# Patient Record
Sex: Male | Born: 1975 | Hispanic: Refuse to answer | Marital: Married | Smoking: Never smoker
Health system: Southern US, Community
[De-identification: ages and names within clinical notes are randomized; demographics above are authoritative.]

## PROBLEM LIST (undated history)

## (undated) DIAGNOSIS — J329 Chronic sinusitis, unspecified: Secondary | ICD-10-CM

---

## 2002-05-27 ENCOUNTER — Emergency Department (HOSPITAL_COMMUNITY): Admission: EM | Admit: 2002-05-27 | Discharge: 2002-05-27 | Payer: Self-pay | Admitting: *Deleted

## 2004-10-01 ENCOUNTER — Emergency Department (HOSPITAL_COMMUNITY): Admission: EM | Admit: 2004-10-01 | Discharge: 2004-10-01 | Payer: Self-pay | Admitting: Emergency Medicine

## 2011-09-04 ENCOUNTER — Emergency Department (HOSPITAL_COMMUNITY)
Admission: EM | Admit: 2011-09-04 | Discharge: 2011-09-04 | Disposition: A | Discharge status: Home / Self Care | Payer: Self-pay | Source: Home / Self Care | Attending: Emergency Medicine | Admitting: Emergency Medicine

## 2011-09-04 ENCOUNTER — Encounter: Payer: Self-pay | Admitting: *Deleted

## 2011-09-04 DIAGNOSIS — J329 Chronic sinusitis, unspecified: Secondary | ICD-10-CM

## 2011-09-04 HISTORY — DX: Chronic sinusitis, unspecified: J32.9

## 2011-09-04 MED ORDER — AMOXICILLIN 500 MG PO CAPS: 1000.0000 mg | ORAL_CAPSULE | Freq: Three times a day (TID) | ORAL | Status: AC

## 2011-09-04 NOTE — ED Notes (Addendum)
Pt with osnet of sinus congestion/pressure/headache onset Friday - per pt history of sinus infections - took left over cephalexin x one

## 2011-09-04 NOTE — ED Provider Notes (Signed)
History     CSN: [id number]  Arrival date & time 09/04/11  1222   First MD Initiated Contact with Patient 09/04/11 1227      Chief Complaint  Patient presents with  . Nasal Congestion  . Cough  . Chills  . Generalized Body Aches    (Consider location/radiation/quality/duration/timing/severity/associated sxs/prior treatment) HPI Comments: Dreden has had a three-day history of nasal congestion with thick, yellow drainage, headache, sinus pressure, postnasal drip, cough productive of yellow sputum, sore throat, chills, and aches.  Patient is a 35 y.o. male presenting with cough.  Cough Associated symptoms include chills, rhinorrhea, sore throat and myalgias. Pertinent negatives include no ear pain, no shortness of breath, no wheezing and no eye redness.    Past Medical History  Diagnosis Date  . Sinus infection     History reviewed. No pertinent past surgical history.  History reviewed. No pertinent family history.  History  Substance Use Topics  . Smoking status: Never Smoker   . Smokeless tobacco: Not on file  . Alcohol Use: No      Review of Systems  Constitutional: Positive for chills and fatigue. Negative for fever.  HENT: Positive for congestion, sore throat, rhinorrhea and postnasal drip. Negative for ear pain, sneezing, neck stiffness and voice change.   Eyes: Negative for pain, discharge and redness.  Respiratory: Positive for cough. Negative for chest tightness, shortness of breath and wheezing.   Gastrointestinal: Negative for nausea, vomiting, abdominal pain and diarrhea.  Musculoskeletal: Positive for myalgias.  Skin: Negative for rash.    Allergies  Review of patient's allergies indicates no known allergies.  Home Medications   Current Outpatient Rx  Name Route Sig Dispense Refill  . GUAIFENESIN ER 600 MG PO TB12 Oral Take 1,200 mg by mouth 2 (two) times daily.      . AMOXICILLIN 500 MG PO CAPS Oral Take 2 capsules (1,000 mg total) by mouth 3  (three) times daily. 60 capsule 0    BP 118/86  Pulse 77  Temp(Src) 98.5 F (36.9 C) (Oral)  Resp 14  SpO2 97%  Physical Exam  Nursing note and vitals reviewed. Constitutional: He appears well-developed and well-nourished. No distress.  HENT:  Head: Normocephalic and atraumatic.  Right Ear: External ear normal.  Left Ear: External ear normal.  Mouth/Throat: Oropharynx is clear and moist. No oropharyngeal exudate.       Nasal mucosa is congested without any drainage. There is tenderness to palpation over the maxillary and frontal sinuses.  Eyes: Conjunctivae and EOM are normal. Pupils are equal, round, and reactive to light. Right eye exhibits no discharge. Left eye exhibits no discharge.  Neck: Normal range of motion. Neck supple.  Cardiovascular: Normal rate, regular rhythm and normal heart sounds.   Pulmonary/Chest: Effort normal and breath sounds normal. No stridor. No respiratory distress. He has no wheezes. He has no rales. He exhibits no tenderness.  Lymphadenopathy:    He has no cervical adenopathy.  Skin: Skin is warm and dry. No rash noted. He is not diaphoretic.    ED Course  Procedures (including critical care time)  Labs Reviewed - No data to display No results found.   1. Sinusitis       MDM  He has sinusitis. We'll treat with a course of amoxicillin.        Roque Lias, MD 09/04/11 [phone]

## 2011-11-20 ENCOUNTER — Encounter (HOSPITAL_COMMUNITY): Payer: Self-pay | Admitting: *Deleted

## 2011-11-20 ENCOUNTER — Emergency Department (HOSPITAL_COMMUNITY): Payer: BC Managed Care – PPO

## 2011-11-20 ENCOUNTER — Emergency Department (HOSPITAL_COMMUNITY)
Admission: EM | Admit: 2011-11-20 | Discharge: 2011-11-21 | Disposition: A | Discharge status: Home / Self Care | Payer: BC Managed Care – PPO | Attending: Emergency Medicine | Admitting: Emergency Medicine

## 2011-11-20 DIAGNOSIS — R197 Diarrhea, unspecified: Secondary | ICD-10-CM | POA: Insufficient documentation

## 2011-11-20 DIAGNOSIS — A084 Viral intestinal infection, unspecified: Secondary | ICD-10-CM

## 2011-11-20 DIAGNOSIS — R112 Nausea with vomiting, unspecified: Secondary | ICD-10-CM | POA: Insufficient documentation

## 2011-11-20 DIAGNOSIS — R509 Fever, unspecified: Secondary | ICD-10-CM | POA: Insufficient documentation

## 2011-11-20 DIAGNOSIS — R61 Generalized hyperhidrosis: Secondary | ICD-10-CM | POA: Insufficient documentation

## 2011-11-20 DIAGNOSIS — A088 Other specified intestinal infections: Secondary | ICD-10-CM | POA: Insufficient documentation

## 2011-11-20 LAB — COMPREHENSIVE METABOLIC PANEL
Alkaline Phosphatase: 55 U/L (ref 39–117)
BUN: 12 mg/dL (ref 6–23)
CO2: 25 mEq/L (ref 19–32)
Chloride: 103 mEq/L (ref 96–112)
GFR calc Af Amer: >90 mL/min (ref >90)
GFR calc non Af Amer: 80 mL/min — ABNORMAL LOW (ref >90)
Glucose, Bld: 114 mg/dL — ABNORMAL HIGH (ref 70–99)
Potassium: 3.1 mEq/L — ABNORMAL LOW (ref 3.5–5.1)
Total Bilirubin: 1.2 mg/dL (ref 0.3–1.2)

## 2011-11-20 LAB — URINALYSIS, ROUTINE W REFLEX MICROSCOPIC
Ketones, ur: 15 mg/dL — AB
Nitrite: NEGATIVE
Protein, ur: 30 mg/dL — AB
Urobilinogen, UA: 1 mg/dL (ref 0.0–1.0)
pH: 6 (ref 5.0–8.0)

## 2011-11-20 LAB — DIFFERENTIAL
Lymphs Abs: 0.7 10*3/uL (ref 0.7–4.0)
Monocytes Relative: 7 % (ref 3–12)
Neutro Abs: 4.3 10*3/uL (ref 1.7–7.7)
Neutrophils Relative %: 79 % — ABNORMAL HIGH (ref 43–77)

## 2011-11-20 LAB — CBC
HCT: 36.2 % — ABNORMAL LOW (ref 39.0–52.0)
Hemoglobin: 12.2 g/dL — ABNORMAL LOW (ref 13.0–17.0)
RBC: 4.16 MIL/uL — ABNORMAL LOW (ref 4.22–5.81)

## 2011-11-20 LAB — URINE MICROSCOPIC-ADD ON

## 2011-11-20 LAB — LIPASE, BLOOD: Lipase: 16 U/L (ref 11–59)

## 2011-11-20 MED ORDER — SODIUM CHLORIDE 0.9 % IV SOLN
Freq: Once | INTRAVENOUS | Status: AC
  Administered 2011-11-20: 1000 mL via INTRAVENOUS

## 2011-11-20 MED ORDER — ACETAMINOPHEN 325 MG PO TABS
650.0000 mg | ORAL_TABLET | Freq: Once | ORAL | Status: AC
  Administered 2011-11-20: 650 mg via ORAL
  Filled 2011-11-20: qty 1

## 2011-11-20 MED ORDER — ONDANSETRON HCL 4 MG/2ML IJ SOLN
4.0000 mg | Freq: Once | INTRAMUSCULAR | Status: AC
  Administered 2011-11-20: 4 mg via INTRAVENOUS
  Filled 2011-11-20: qty 2

## 2011-11-20 MED ORDER — ONDANSETRON 4 MG PO TBDP
8.0000 mg | ORAL_TABLET | Freq: Once | ORAL | Status: AC
  Administered 2011-11-20: 8 mg via ORAL
  Filled 2011-11-20: qty 2

## 2011-11-20 NOTE — ED Notes (Signed)
The pt has had vomiting and diarrhea since last pm.  He has a burning in his abd and he has been coughing

## 2011-11-20 NOTE — ED Provider Notes (Signed)
History     CSN: [id number]  Arrival date & time 11/20/11  2019   First MD Initiated Contact with Patient 11/20/11 2128      Chief Complaint  Patient presents with  . Emesis    (Consider location/radiation/quality/duration/timing/severity/associated sxs/prior treatment) HPI Comments: Patient is a 36 year old man who says he started with nausea vomiting and diarrhea last night. This has persisted through the day. He's had some fever as well. He works for [employer], and so comes into contact with a lot of different people, but does not recognize any specific exposure to someone with a vomiting and diarrhea illness. He therefore sought evaluation and treatment.  Patient is a 36 y.o. male presenting with vomiting. The history is provided by the patient. No language interpreter was used.  Emesis  This is a new problem. The current episode started 12 to 24 hours ago. The problem occurs more than 10 times per day. The problem has not changed since onset.The emesis has an appearance of stomach contents. The maximum temperature recorded prior to his arrival was 101 to 101.9 F. Associated symptoms include chills, diarrhea, a fever and sweats.    Past Medical History  Diagnosis Date  . Sinus infection     History reviewed. No pertinent past surgical history.  No family history on file.  History  Substance Use Topics  . Smoking status: Never Smoker   . Smokeless tobacco: Not on file  . Alcohol Use: No      Review of Systems  Constitutional: Positive for fever and chills.  HENT: Negative.   Eyes: Negative.   Respiratory: Negative.   Cardiovascular: Negative.   Gastrointestinal: Positive for nausea, vomiting and diarrhea.  Genitourinary: Negative.   Musculoskeletal: Negative.   Skin: Negative.   Neurological: Negative.   Psychiatric/Behavioral: Negative.     Allergies  Review of patient's allergies indicates no known allergies.  Home Medications   Current Outpatient Rx    Name Route Sig Dispense Refill  . BISMUTH SUBSALICYLATE 262 MG PO CHEW Oral Chew 524 mg by mouth as needed. For nausea/vomiting    . IBUPROFEN 200 MG PO TABS Oral Take 800 mg by mouth every 6 (six) hours as needed. For pain.    Marland Kitchen ONDANSETRON HCL 8 MG PO TABS Oral Take by mouth every 8 (eight) hours as needed. For nausea and vomiting      BP 114/65  Pulse 83  Temp(Src) 101.1 F (38.4 C) (Oral)  Resp 18  SpO2 97%  Physical Exam  Nursing note and vitals reviewed. Constitutional: He is oriented to person, place, and time. He appears well-developed and well-nourished. Distressed: in mild to moderate distress with epigastric pain.  HENT:  Head: Normocephalic and atraumatic.  Right Ear: External ear normal.  Left Ear: External ear normal.  Nose: Nose normal.  Mouth/Throat: Oropharynx is clear and moist.  Eyes: Conjunctivae are normal. Pupils are equal, round, and reactive to light. No scleral icterus.  Neck: Normal range of motion. Neck supple.  Cardiovascular: Normal rate, regular rhythm and normal heart sounds.   Pulmonary/Chest: Effort normal and breath sounds normal.  Abdominal: Soft. There is Tenderness: he has mild epigastric tenderness..  Musculoskeletal: Normal range of motion.  Neurological: He is alert and oriented to person, place, and time.       No sensory or motor deficit.  Skin: Skin is warm and dry.  Psychiatric: He has a normal mood and affect. His behavior is normal.    ED Course  Procedures (including  critical care time)  Labs Reviewed  CBC - Abnormal; Notable for the following:    RBC 4.16 (*)    Hemoglobin 12.2 (*)    HCT 36.2 (*)    All other components within normal limits  DIFFERENTIAL - Abnormal; Notable for the following:    Neutrophils Relative 79 (*)    All other components within normal limits  COMPREHENSIVE METABOLIC PANEL - Abnormal; Notable for the following:    Potassium 3.1 (*)    Glucose, Bld 114 (*)    Calcium 8.0 (*)    Albumin 3.2 (*)     GFR calc non Af Amer 80 (*)    All other components within normal limits  URINALYSIS, ROUTINE W REFLEX MICROSCOPIC - Abnormal; Notable for the following:    Color, Urine AMBER (*) BIOCHEMICALS MAY BE AFFECTED BY COLOR   APPearance CLOUDY (*)    Specific Gravity, Urine 1.037 (*)    Bilirubin Urine SMALL (*)    Ketones, ur 15 (*)    Protein, ur 30 (*)    All other components within normal limits  LIPASE, BLOOD  URINE MICROSCOPIC-ADD ON  URINE CULTURE   Dg Abd Acute W/chest  11/20/2011  *RADIOLOGY REPORT*  Clinical Data: Nausea, vomiting, diarrhea and epigastric abdominal pain.  Substernal burning sensation.  ACUTE ABDOMEN SERIES (ABDOMEN 2 VIEW & CHEST 1 VIEW)  Comparison: None.  Findings: The lungs are well-aerated and clear.  There is no evidence of focal opacification, pleural effusion or pneumothorax. The cardiomediastinal silhouette is within normal limits.  The visualized bowel gas pattern is unremarkable.  Scattered fluid and air are seen within the colon; there is no evidence of small bowel dilatation to suggest obstruction.  No free intra-abdominal air is identified on the provided upright view.  No acute osseous abnormalities are seen; the sacroiliac joints are unremarkable in appearance.  IMPRESSION:  1.  Unremarkable bowel gas pattern; no free intra-abdominal air seen. 2.  No acute cardiopulmonary process identified.  Original Report Authenticated By: Tonia Ghent, M.D.   12:03 AM I reviewed pt's lab results with him and with his wife.  He has mild hypokalemia and UA evidence of dehydration.  Will give a third liter of normal saline.  After that he will probably be able to go home with clear liquids, antiemetics.  1. Viral gastroenteritis           Carleene Cooper III, MD 11/21/11 [phone]

## 2011-11-20 NOTE — ED Notes (Signed)
C/o chills and temp

## 2011-11-21 MED ORDER — SODIUM CHLORIDE 0.9 % IV SOLN
Freq: Once | INTRAVENOUS | Status: AC
  Administered 2011-11-21: 1000 mL via INTRAVENOUS

## 2011-11-21 MED ORDER — PROMETHAZINE HCL 25 MG PO TABS: 25.0000 mg | ORAL_TABLET | Freq: Four times a day (QID) | ORAL | Status: AC | PRN

## 2011-11-22 LAB — URINE CULTURE: Culture  Setup Time: 201303180206

## 2015-12-13 ENCOUNTER — Emergency Department (HOSPITAL_COMMUNITY)
Admission: EM | Admit: 2015-12-13 | Discharge: 2015-12-13 | Disposition: A | Discharge status: Home / Self Care | Payer: Self-pay | Attending: Emergency Medicine | Admitting: Emergency Medicine

## 2015-12-13 ENCOUNTER — Emergency Department (HOSPITAL_COMMUNITY): Payer: Self-pay

## 2015-12-13 ENCOUNTER — Encounter (HOSPITAL_COMMUNITY): Payer: Self-pay

## 2015-12-13 DIAGNOSIS — Z8709 Personal history of other diseases of the respiratory system: Secondary | ICD-10-CM | POA: Insufficient documentation

## 2015-12-13 DIAGNOSIS — M222X2 Patellofemoral disorders, left knee: Secondary | ICD-10-CM | POA: Insufficient documentation

## 2015-12-13 MED ORDER — IBUPROFEN 800 MG PO TABS: 800.0000 mg | ORAL_TABLET | Freq: Three times a day (TID) | ORAL | Status: AC

## 2015-12-13 MED ORDER — IBUPROFEN 800 MG PO TABS
800.0000 mg | ORAL_TABLET | Freq: Once | ORAL | Status: AC
  Administered 2015-12-13: 800 mg via ORAL
  Filled 2015-12-13: qty 1

## 2015-12-13 NOTE — ED Notes (Signed)
Pt presents with c/o left knee pain that started approx one month ago. Pt does have screws in his knee from a previous injury. Denies any new injury to that knee.

## 2015-12-13 NOTE — ED Provider Notes (Signed)
CSN: [id number]     Arrival date & time 12/13/15  1343 History  By signing my name below, I, Adam Hurley, attest that this documentation has been prepared under the direction and in the presence of non-physician practitioner, Alveta Heimlich, PA-C. Electronically Signed: Linna Hurley, Scribe. 12/13/2015. 2:08 PM.    Chief Complaint  Patient presents with  . Knee Pain    The history is provided by the patient. No language interpreter was used.     HPI Comments: Adam Hurley is a 40 y.o. male with no pertinent PMHx who presents to the Emergency Department complaining of constant, severe, sharp, popping, non-radiating, left knee pain onset 2-3 weeks. He states his pain feels like it is underneath his kneecap. He states that he has screws in his left knee from a previous surgery; he had left knee surgery due to a ligamentous injury caused by running track in college. He denies new injury to his left knee. Pt reports that he experiences constant left knee pain with flexion/extension, squatting, jumping and ambulation but resting will occasionally alleviate his pain. He endorses pain exacerbation with palpation to his left kneecap. Pt reports intermittent left knee stiffness that resolves with movement. He notes that he experienced one episode of left knee swelling a few weeks ago but this has resolved. He works at [employer] and admits to being on his feet on a concrete surface for 9 hours a day. Pt has not tried any medications for his pain. He denies lower left leg swelling, numbness, pain, erythema or any other associated symptoms.  Past Medical History  Diagnosis Date  . Sinus infection    History reviewed. No pertinent past surgical history. No family history on file. Social History  Substance Use Topics  . Smoking status: Never Smoker   . Smokeless tobacco: None  . Alcohol Use: No    Review of Systems  Musculoskeletal: Positive for arthralgias (left knee). Negative for joint swelling.   Neurological: Negative for numbness.  All other systems reviewed and are negative.     Allergies  Review of patient's allergies indicates no known allergies.  Home Medications   Prior to Admission medications   Medication Sig Start Date End Date Taking? Authorizing Provider  bismuth subsalicylate (PEPTO BISMOL) 262 MG chewable tablet Chew 524 mg by mouth as needed. For nausea/vomiting    Historical Provider, MD  ibuprofen (ADVIL,MOTRIN) 200 MG tablet Take 800 mg by mouth every 6 (six) hours as needed. For pain.    Historical Provider, MD  ibuprofen (ADVIL,MOTRIN) 800 MG tablet Take 1 tablet (800 mg total) by mouth 3 (three) times daily. 12/13/15   Tanikka Bresnan, PA-C  ondansetron (ZOFRAN) 8 MG tablet Take by mouth every 8 (eight) hours as needed. For nausea and vomiting    Historical Provider, MD   BP 124/72 mmHg  Pulse 72  Temp(Src) 98.5 F (36.9 C) (Oral)  Resp 18  SpO2 98% Physical Exam  Constitutional: He is oriented to person, place, and time. He appears well-developed and well-nourished. No distress.  HENT:  Head: Normocephalic and atraumatic.  Eyes: Conjunctivae and EOM are normal.  Neck: Neck supple. No tracheal deviation present.  Cardiovascular: Normal rate and intact distal pulses.   Pedal pulse palpable  Pulmonary/Chest: Effort normal. No respiratory distress.  Musculoskeletal: Normal range of motion.       Left knee: He exhibits normal range of motion, no effusion, no deformity, no erythema, no LCL laxity, normal patellar mobility and no MCL laxity. Tenderness  found.       Legs: Tenderness to palpation over patella. No tenderness over medial or lateral joint lines. No popliteal fossa tenderness. FROM intact and pt ambulates with a steady gait. No appreciable effusion. No abnormal patellar movement. No ligamentous laxity. No posterior calf tenderness, erythema, or palpable cords.   Neurological: He is alert and oriented to person, place, and time.  5/5 strength of  the bilateral lower extremities. Sensation to light touch intact throughout.   Skin: Skin is warm and dry.  Psychiatric: He has a normal mood and affect. His behavior is normal.  Nursing note and vitals reviewed.   ED Course  Procedures (including critical care time)  DIAGNOSTIC STUDIES: Oxygen Saturation is 98% on RA, normal by my interpretation.    COORDINATION OF CARE: 2:08 PM Discussed treatment plan with pt at bedside and pt agreed to plan.  Labs Review Labs Reviewed - No data to display  Imaging Review Dg Knee Complete 4 Views Left  12/13/2015  CLINICAL DATA:  Pt states previous injury to left knee in high school, and surgery in college with "dissolvable screws". Pain onset x 1 month ago to subpatellar area, and often hears/feels a "pop". EXAM: LEFT KNEE - COMPLETE 4+ VIEW COMPARISON:  None. FINDINGS: No fracture.  No bone lesion. Knee joint normally spaced and aligned. There is evidence of a minimal joint effusion above the patella. Soft tissues are unremarkable. IMPRESSION: Minimal joint effusion.  Otherwise unremarkable. Electronically Signed   By: Amie Portlandavid  Ormond M.D.   On: 12/13/2015 14:29   I have personally reviewed and evaluated these images and lab results as part of my medical decision-making.   EKG Interpretation None      MDM   Final diagnoses:  Patellofemoral syndrome, left   Patient presenting with left knee pain x 3 weeks. Pain is "underneath" the patella and exacerbated by movement. Left lower extremity is neurovascularly intact with FROM. Tenderness to palpation of the patella. No abnormal patellar mobility or ligamentous laxity. No appreciable effusion. No tenderness over patellar tendon. Patient X-Ray negative for obvious fracture or dislocation. Notes minimal effusion above patella. Pain managed in ED with ibuprofen. Pt is able to ambulate with a steady gait. Presentation consistent with a patellofemoral pain syndrome. Knee sleeve given and conservative  therapy recommended. Discussed RICE therapy and use of OTC pain relievers. Pt advised to follow up with orthopedics if symptoms persist. Return precautions discussed at bedside and given in discharge paperwork. Pt is stable for discharge.  I personally performed the services described in this documentation, which was scribed in my presence. The recorded information has been reviewed and is accurate.     Alveta HeimlichStevi Derenda Giddings, PA-C 12/13/15 1453  Lyndal Pulleyaniel Knott, MD 12/13/15 [id number]

## 2015-12-13 NOTE — Discharge Instructions (Signed)
Patellofemoral Pain Syndrome  Patellofemoral pain syndrome is a condition that involves a softening or breakdown of the tissue (cartilage) on the underside of your kneecap (patella). This causes pain in the front of the knee. The condition is also called runner's knee or chondromalacia patella. Patellofemoral pain syndrome is most common in young adults who are active in sports.  Your knee is the largest joint in your body. The patella covers the front of your knee and is attached to muscles above and below your knee. The underside of the patella is covered with a smooth type of cartilage (synovium). The smooth surface helps the patella glide easily when you move your knee. Patellofemoral pain syndrome causes swelling in the joint linings and bone surfaces in your knee.   CAUSES   Patellofemoral pain syndrome can be caused by:   Overuse.   Poor alignment of your knee joints.   Weak leg muscles.   A direct blow to your kneecap.  RISK FACTORS  You may be at risk for patellofemoral pain syndrome if you:   Do a lot of activities that can wear down your kneecap. These include:    Running.    Squatting.    Climbing stairs.   Start a new physical activity or exercise program.   Wear shoes that do not fit well.   Do not have good leg strength.   Are overweight.  SIGNS AND SYMPTOMS   Knee pain is the most common symptom of patellofemoral pain syndrome. This may feel like a dull, aching pain underneath your patella, in the front of your knee. There may be a popping or cracking sound when you move your knee. Pain may get worse with:   Exercise.   Climbing stairs.   Running.   Jumping.   Squatting.   Kneeling.   Sitting for a long time.   Moving or pushing on your patella.  DIAGNOSIS   Your health care provider may be able to diagnose patellofemoral pain syndrome from your symptoms and medical history. You may be asked about your recent physical activities and which ones cause knee pain. Your health care  provider may do a physical exam with certain tests to confirm the diagnosis. These may include:   Moving your patella back and forth.   Checking your range of knee motion.   Having you squat or jump to see if you have pain.   Checking the strength of your leg muscles.  An MRI of the knee may also be done.  TREATMENT   Patellofemoral pain syndrome can usually be treated at home with rest, ice, compression, and elevation (RICE). Other treatments may include:   Nonsteroidal anti-inflammatory drugs (NSAIDs).   Physical therapy to stretch and strengthen your leg muscles.   Shoe inserts (orthotics) to take stress off your knee.   A knee brace or knee support.   Surgery to remove damaged cartilage or move the patella to a better position. The need for surgery is rare.  HOME CARE INSTRUCTIONS    Take medicines only as directed by your health care provider.   Rest your knee.   When resting, keep your knee raised above the level of your heart.   Avoid activities that cause knee pain.   Apply ice to the injured area:   Put ice in a plastic bag.   Place a towel between your skin and the bag.   Leave the ice on for 20 minutes, 2-3 times a day.   Use splints,   braces, knee supports, or walking aids as directed by your health care provider.   Perform stretching and strengthening exercises as directed by your health care provider or physical therapist.   Keep all follow-up visits as directed by your health care provider. This is important.  SEEK MEDICAL CARE IF:    Your symptoms get worse.   You are not improving with home care.  MAKE SURE YOU:   Understand these instructions.   Will watch your condition.   Will get help right away if you are not doing well or get worse.     This information is not intended to replace advice given to you by your health care provider. Make sure you discuss any questions you have with your health care provider.     Document Released: 08/10/2009 Document Revised: 09/12/2014  Document Reviewed: 11/11/2013  [employer]vier Interactive Patient Education 2016 [employer]

## 2016-03-20 ENCOUNTER — Encounter (HOSPITAL_COMMUNITY): Payer: Self-pay | Admitting: Oncology

## 2016-03-20 ENCOUNTER — Emergency Department (HOSPITAL_COMMUNITY)
Admission: EM | Admit: 2016-03-20 | Discharge: 2016-03-20 | Disposition: A | Discharge status: Home / Self Care | Payer: Self-pay | Attending: Emergency Medicine | Admitting: Emergency Medicine

## 2016-03-20 DIAGNOSIS — K047 Periapical abscess without sinus: Secondary | ICD-10-CM | POA: Insufficient documentation

## 2016-03-20 MED ORDER — AMOXICILLIN-POT CLAVULANATE 875-125 MG PO TABS
1.0000 | ORAL_TABLET | Freq: Once | ORAL | Status: AC
  Administered 2016-03-20: 1 via ORAL
  Filled 2016-03-20: qty 1

## 2016-03-20 MED ORDER — AMOXICILLIN-POT CLAVULANATE 875-125 MG PO TABS: 1.0000 | ORAL_TABLET | Freq: Two times a day (BID) | ORAL | Status: AC

## 2016-03-20 MED ORDER — HYDROCODONE-ACETAMINOPHEN 5-325 MG PO TABS: 1.0000 | ORAL_TABLET | Freq: Four times a day (QID) | ORAL | Status: AC | PRN

## 2016-03-20 NOTE — ED Notes (Signed)
Pt presents d/t right upper dental pain that developed last night at approximately 1700.  Swelling noted to right side of pt's face.  Rates pain 10/10, dull aching in nature.  Pt is concerned he has abscess an his tooth.

## 2016-03-20 NOTE — Discharge Instructions (Signed)
Dental Abscess °A dental abscess is a collection of pus in or around a tooth. °CAUSES °This condition is caused by a bacterial infection around the root of the tooth that involves the inner part of the tooth (pulp). It may result from: °· Severe tooth decay. °· Trauma to the tooth that allows bacteria to enter into the pulp, such as a broken or chipped tooth. °· Severe gum disease around a tooth. °SYMPTOMS °Symptoms of this condition include: °· Severe pain in and around the infected tooth. °· Swelling and redness around the infected tooth, in the mouth, or in the face. °· Tenderness. °· Pus drainage. °· Bad breath. °· Bitter taste in the mouth. °· Difficulty swallowing. °· Difficulty opening the mouth. °· Nausea. °· Vomiting. °· Chills. °· Swollen neck glands. °· Fever. °DIAGNOSIS °This condition is diagnosed with examination of the infected tooth. During the exam, your dentist may tap on the infected tooth. Your dentist will also ask about your medical and dental history and may order X-rays. °TREATMENT °This condition is treated by eliminating the infection. This may be done with: °· Antibiotic medicine. °· A root canal. This may be performed to save the tooth. °· Pulling (extracting) the tooth. This may also involve draining the abscess. This is done if the tooth cannot be saved. °HOME CARE INSTRUCTIONS °· Take medicines only as directed by your dentist. °· If you were prescribed antibiotic medicine, finish all of it even if you start to feel better. °· Rinse your mouth (gargle) often with salt water to relieve pain or swelling. °· Do not drive or operate heavy machinery while taking pain medicine. °· Do not apply heat to the outside of your mouth. °· Keep all follow-up visits as directed by your dentist. This is important. °SEEK MEDICAL CARE IF: °· Your pain is worse and is not helped by medicine. °SEEK IMMEDIATE MEDICAL CARE IF: °· You have a fever or chills. °· Your symptoms suddenly get worse. °· You have a  very bad headache. °· You have problems breathing or swallowing. °· You have trouble opening your mouth. °· You have swelling in your neck or around your eye. °  °This information is not intended to replace advice given to you by your health care provider. Make sure you discuss any questions you have with your health care provider. °  °Document Released: 08/22/2005 Document Revised: 01/06/2015 Document Reviewed: 08/19/2014 °Elsevier Interactive Patient Education ©2016 Elsevier Inc. °Community Resource Guide Dental °The United Way’s “211” is a great source of information about community services available.  Access by dialing 2-1-1 from anywhere in [place], or by website -  www.nc211.org.  ° °Other Local Resources (Updated 09/2015) ° °Dental  Care °  °Services ° °  °Phone Number and Address  °Cost  °[facility] For children 0 - 21 years of age:  °• Cleaning °• Tooth brushing/flossing instruction °• Sealants, fillings, crowns °• Extractions °• Emergency treatment  [phone] °[address] °[place], [place] [address] Charges based on family income.  Medicaid and some insurance plans accepted.   °  °[facility] [place] • Cleaning °• Sealants, fillings, crowns °• Extractions °• Emergency treatment [phone] °[address] °[place], [place] ° Pregnant women 18 years of age or older with a Medicaid card  °[facility] [place] • Cleaning °• Sealants, fillings, crowns °• Extractions °• Emergency treatment [phone] °[address] °[place], [place] Pregnant   women 18 years of age or older with a Medicaid card  °[facility] For children 0 - 21 years of age:  °• Cleaning °• Tooth brushing/flossing instruction °• Sealants, fillings, crowns °• Extractions °• Emergency treatment °Limited orthodontic services for patients with Medicaid [phone] °[address] °[place], [place] [address] Medicaid and [place] Health Choice cover for children up to age 21 and pregnant women.  Parents of children up to age 21 without Medicaid pay a reduced fee at time of service.  °[facility] [place] For children 0 - 21 years of age:  °• Cleaning °• Tooth brushing/flossing instruction °• Sealants, fillings, crowns °• Extractions °• Emergency treatment °Limited orthodontic services for patients with Medicaid [phone] °[address] °[place], [place].  Medicaid and [place] Health Choice cover for children up to age 21 and pregnant women.  Parents of children up to age 21 without Medicaid pay a reduced fee.  °[facility] • Cleaning °• Sealants, fillings, crowns °• Extractions ° °Hours: Tuesdays and Thursdays, 4:15 - 8 pm [phone] °[address] °[place], [place] [address] Services free of charge to [place] residents ages 18-64 who do not have health insurance, Medicare, Medicaid, or VA benefits and fall within federal poverty guidelines  °[facility] ° ° ° Provides dental care in addition to primary medical care, nutritional counseling, and pharmacy: °• Cleaning °• Sealants, fillings, crowns °• Extractions ° ° ° ° ° ° ° ° ° ° ° ° ° ° ° ° ° [phone] °[facility], [address] °[place], [place] ° °[phone] °[facility], [address] [place], [place] ° °[phone] °[facility] °[place], [place] ° °[phone] °[facility], [address] °, [place] ° °[phone] °[facility] °[address] °[place], [place] Accepts Medicaid, Medicare, most insurance.  Also provides services available to all with fees adjusted based on ability to pay.    °[facility] • Cleaning °• Tooth brushing/flossing  instruction °• Sealants, fillings, crowns °• Extractions °• Emergency treatment °Hours: Tuesdays, Thursdays, and Fridays from 8 am to 5 pm by appointment only. [phone] °[address] °[place], [place] [address] [place] residents with Medicaid (depending on eligibility) and children with [place] Health Choice - call for more information.  °[facility] • Extractions only ° °Hours: 2nd and 4th Thursday of each month from 6:30 am - 9 am.   [phone] ext. [phone] °[address] °[place], [place] [address] Ages 18 and older only.  Patients are seen on a first come, first served basis.  °[facility] • Cleanings °• Fillings °• Extractions °• Orthodontics °• Endodontics °• Implants/Crowns/Bridges °• Complete and partial dentures [phone] °[place], [place] Patients must complete an application for services.  There is often a waiting list.   ° °

## 2016-03-20 NOTE — ED Provider Notes (Signed)
CSN: [id number]     Arrival date & time 03/20/16  1922 History   First MD Initiated Contact with Patient 03/20/16 1947     Chief Complaint  Patient presents with  . Dental Pain     The history is provided by the patient. No language interpreter was used.   Adam Hurley is a 40 y.o. male who presents to the Emergency Department complaining of dental pain.  Reports pain and swelling to the right upper face that he relates to a broken tooth. Pain has been there since yesterday. He denies any fevers, throat swelling, difficulty breathing, nausea, vomiting. Symptoms are moderate, constant and worsening. He has no medical problems and does not have a dentist.   Past Medical History  Diagnosis Date  . Sinus infection    History reviewed. No pertinent past surgical history. No family history on file. Social History  Substance Use Topics  . Smoking status: Never Smoker   . Smokeless tobacco: None  . Alcohol Use: No    Review of Systems  All other systems reviewed and are negative.     Allergies  Review of patient's allergies indicates no known allergies.  Home Medications   Prior to Admission medications   Medication Sig Start Date End Date Taking? Authorizing Provider  amoxicillin-clavulanate (AUGMENTIN) 875-125 MG tablet Take 1 tablet by mouth every 12 (twelve) hours. 03/20/16   Tilden Fossa, MD  bismuth subsalicylate (PEPTO BISMOL) 262 MG chewable tablet Chew 524 mg by mouth as needed. For nausea/vomiting    Historical Provider, MD  HYDROcodone-acetaminophen (NORCO/VICODIN) 5-325 MG tablet Take 1 tablet by mouth every 6 (six) hours as needed. 03/20/16   Tilden Fossa, MD  ibuprofen (ADVIL,MOTRIN) 200 MG tablet Take 800 mg by mouth every 6 (six) hours as needed. For pain.    Historical Provider, MD  ibuprofen (ADVIL,MOTRIN) 800 MG tablet Take 1 tablet (800 mg total) by mouth 3 (three) times daily. 12/13/15   Stevi Barrett, PA-C  ondansetron (ZOFRAN) 8 MG tablet Take by mouth  every 8 (eight) hours as needed. For nausea and vomiting    Historical Provider, MD   BP 133/78 mmHg  Pulse 70  Temp(Src) 99 F (37.2 C) (Oral)  Resp 16  Ht  (1.702 m)  Wt 194 lb (87.998 kg)  BMI 30.38 kg/m2  SpO2 99% Physical Exam  Constitutional: He is oriented to person, place, and time. He appears well-developed and well-nourished.  HENT:  Head: Normocephalic and atraumatic.  Mouth/Throat:    Moderate swelling to the right maxillary region.  Neck: Neck supple.  Cardiovascular: Normal rate and regular rhythm.   No murmur heard. Pulmonary/Chest: Effort normal and breath sounds normal. No stridor. No respiratory distress.  Musculoskeletal: He exhibits no edema or tenderness.  Neurological: He is alert and oriented to person, place, and time.  Skin: Skin is warm and dry.  Psychiatric: He has a normal mood and affect. His behavior is normal.  Nursing note and vitals reviewed.   ED Course  Procedures (including critical care time) Labs Review Labs Reviewed - No data to display  Imaging Review No results found. I have personally reviewed and evaluated these images and lab results as part of my medical decision-making.   EKG Interpretation None      MDM   Final diagnoses:  Dental abscess    Patient here for facial pain and swelling. Examination with a dental abscess with clinical symptoms. He is nontoxic appearing on examination with no systemic symptoms.  No evidence of airway compromise. Treating with Augmentin with close dentistry follow-up. Home care and return precautions discussed.  Tilden FossaElizabeth Famous Eisenhardt, MD 03/21/16 [phone]

## 2017-07-15 IMAGING — CR DG KNEE COMPLETE 4+V*L*
4 series · 4 of 4 positions shown · non-contrast
Comparison: None.

CLINICAL DATA: Pt states previous injury to left knee in high
school, and surgery in college with "dissolvable screws". Pain onset
x 1 month ago to subpatellar area, and often hears/feels a "pop".

EXAM:
LEFT KNEE - COMPLETE 4+ VIEW

[t knee ap left]
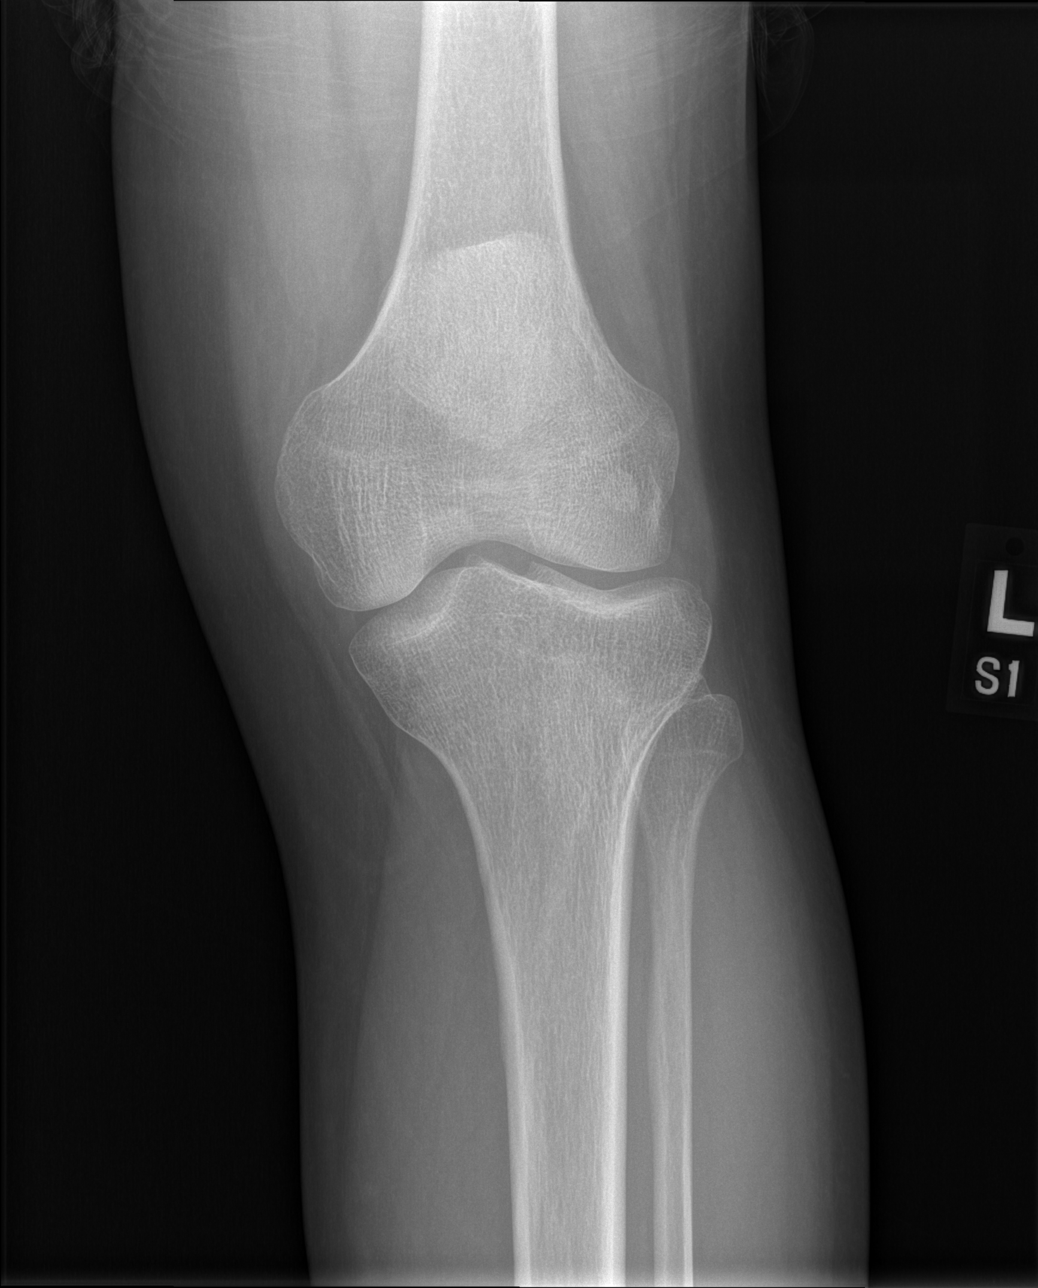

[t knee obl left (1 of 2)]
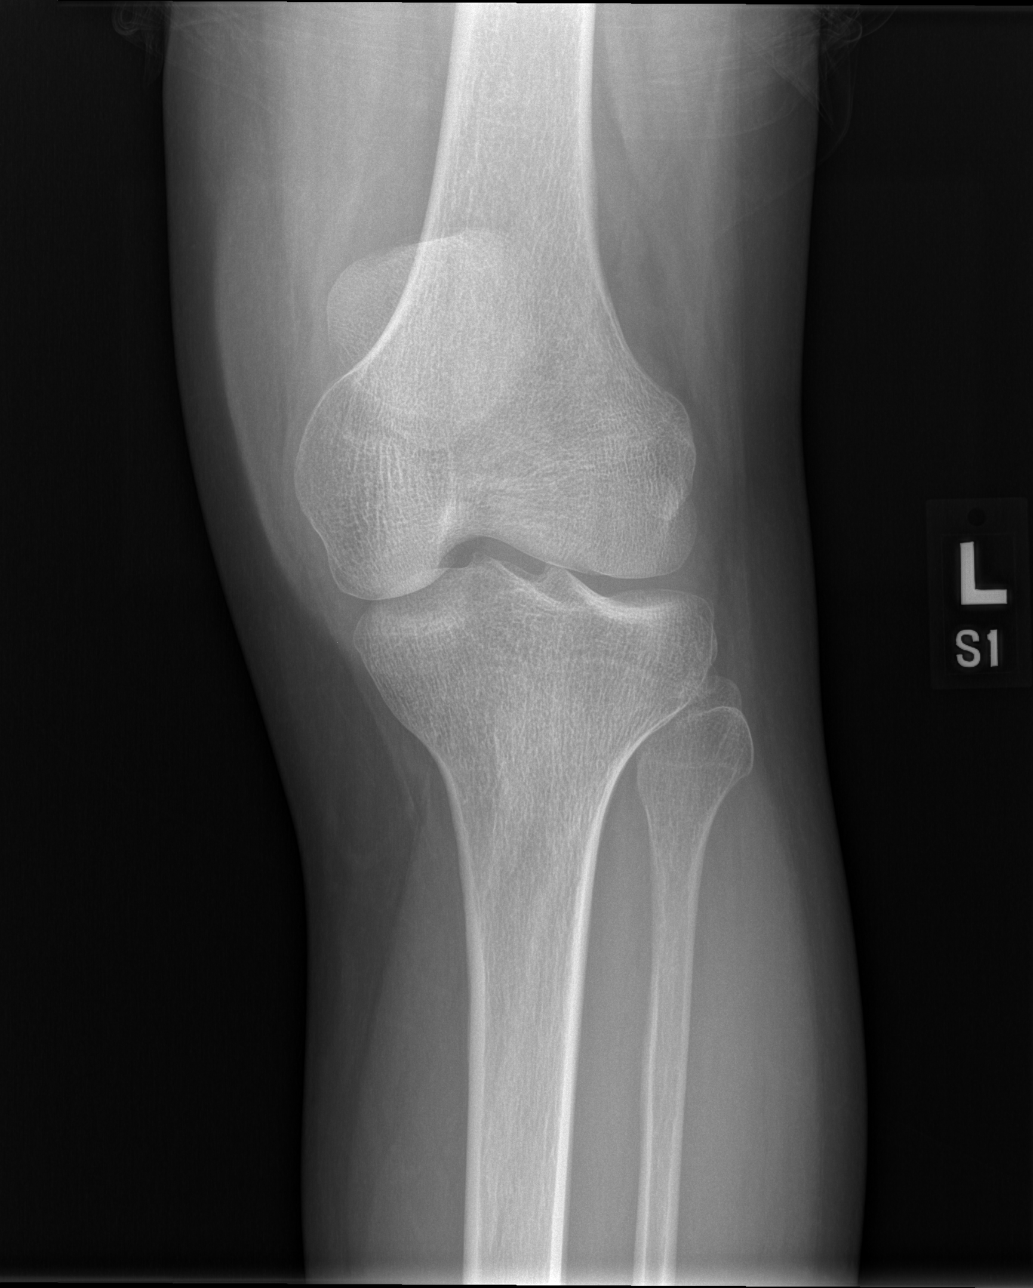

[t knee obl left (2 of 2)]
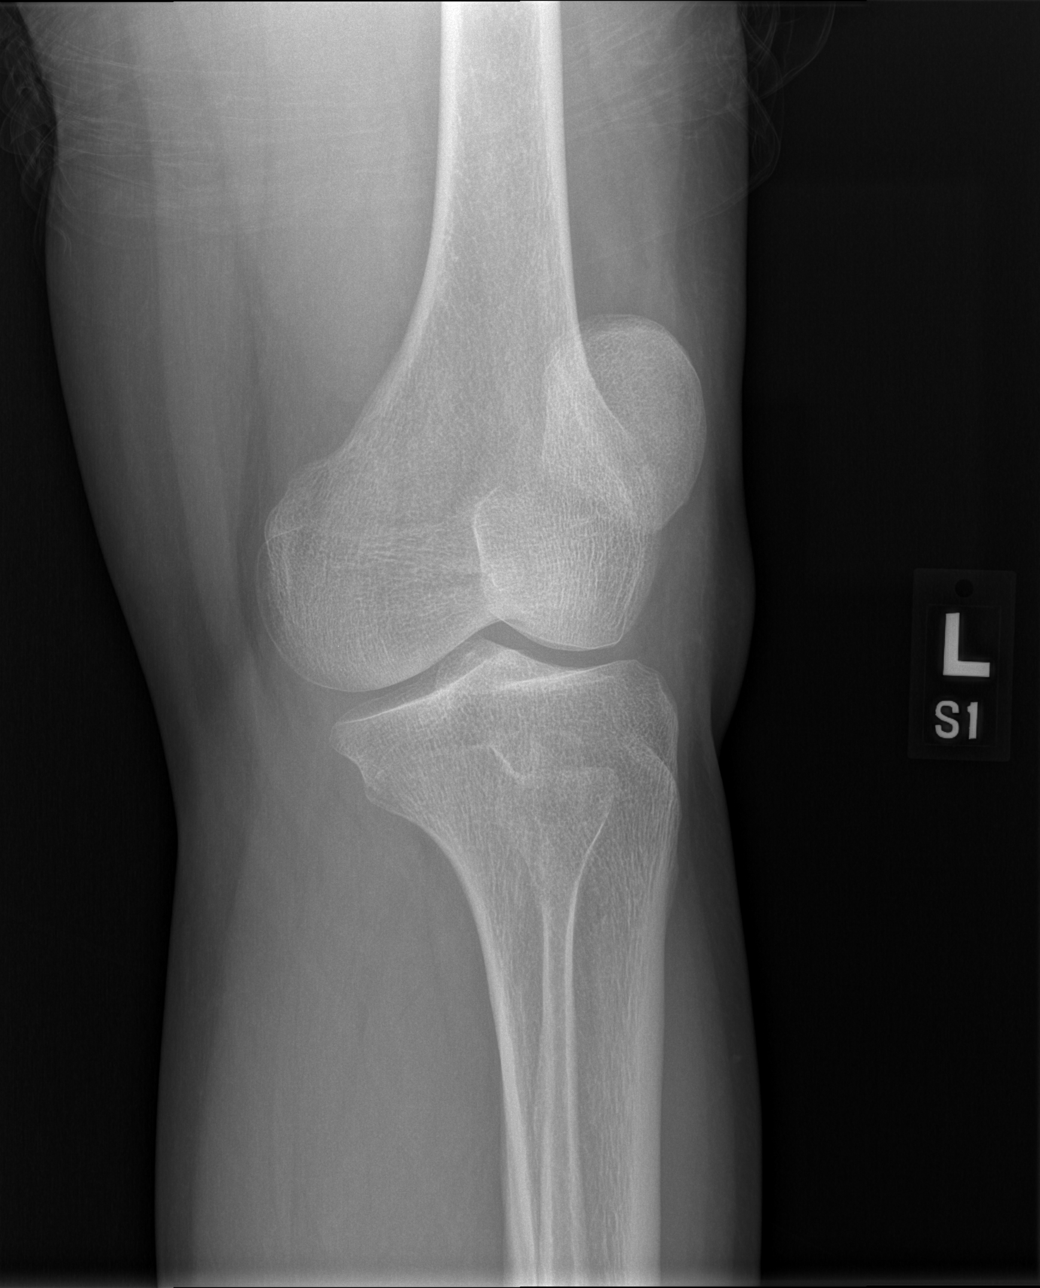

[t knee lat left]
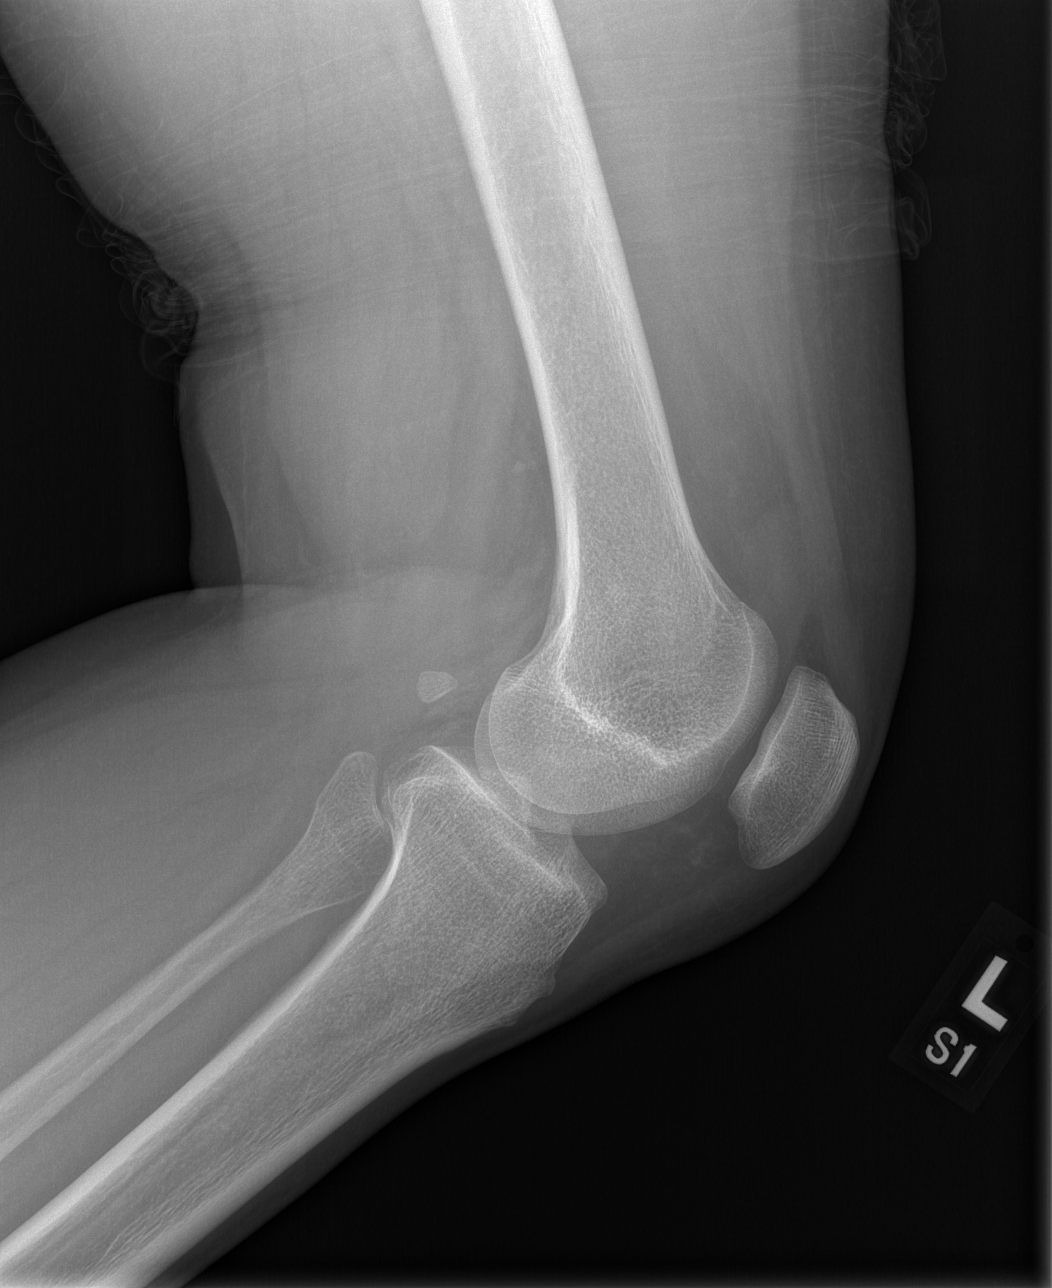

[4 of 4 positions shown; findings below may reference images not displayed]

FINDINGS: No fracture.  No bone lesion.

Knee joint normally spaced and aligned.

There is evidence of a minimal joint effusion above the patella.

Soft tissues are unremarkable.
IMPRESSION: Minimal joint effusion.  Otherwise unremarkable.

## 2018-04-12 ENCOUNTER — Emergency Department (HOSPITAL_COMMUNITY): Payer: Self-pay

## 2018-04-12 ENCOUNTER — Emergency Department (HOSPITAL_COMMUNITY)
Admission: EM | Admit: 2018-04-12 | Discharge: 2018-04-13 | Disposition: A | Discharge status: Home / Self Care | Payer: Self-pay | Attending: Emergency Medicine | Admitting: Emergency Medicine

## 2018-04-12 ENCOUNTER — Encounter (HOSPITAL_COMMUNITY): Payer: Self-pay

## 2018-04-12 ENCOUNTER — Other Ambulatory Visit: Payer: Self-pay

## 2018-04-12 DIAGNOSIS — R590 Localized enlarged lymph nodes: Secondary | ICD-10-CM | POA: Insufficient documentation

## 2018-04-12 DIAGNOSIS — J029 Acute pharyngitis, unspecified: Secondary | ICD-10-CM | POA: Insufficient documentation

## 2018-04-12 LAB — BASIC METABOLIC PANEL
Anion gap: 11 (ref 5–15)
BUN: 10 mg/dL (ref 6–20)
CALCIUM: 8.7 mg/dL — AB (ref 8.9–10.3)
CO2: 27 mmol/L (ref 22–32)
CREATININE: 1.17 mg/dL (ref 0.61–1.24)
Chloride: 100 mmol/L (ref 98–111)
GFR calc Af Amer: >60 mL/min (ref >60)
Glucose, Bld: 103 mg/dL — ABNORMAL HIGH (ref 70–99)
Potassium: 3.3 mmol/L — ABNORMAL LOW (ref 3.5–5.1)
Sodium: 138 mmol/L (ref 135–145)

## 2018-04-12 LAB — CBC WITH DIFFERENTIAL/PLATELET
Basophils Absolute: 0 10*3/uL (ref 0.0–0.1)
Basophils Relative: 0 %
EOS ABS: 0 10*3/uL (ref 0.0–0.7)
EOS PCT: 0 %
HCT: 39.6 % (ref 39.0–52.0)
Hemoglobin: 13.2 g/dL (ref 13.0–17.0)
LYMPHS ABS: 1.1 10*3/uL (ref 0.7–4.0)
Lymphocytes Relative: 13 %
MCH: 29.1 pg (ref 26.0–34.0)
MCHC: 33.3 g/dL (ref 30.0–36.0)
MCV: 87.4 fL (ref 78.0–100.0)
MONOS PCT: 11 %
Monocytes Absolute: 0.9 10*3/uL (ref 0.1–1.0)
Neutro Abs: 6.2 10*3/uL (ref 1.7–7.7)
Neutrophils Relative %: 76 %
PLATELETS: 212 10*3/uL (ref 150–400)
RBC: 4.53 MIL/uL (ref 4.22–5.81)
RDW: 12.9 % (ref 11.5–15.5)
WBC: 8.1 10*3/uL (ref 4.0–10.5)

## 2018-04-12 LAB — MONONUCLEOSIS SCREEN: MONO SCREEN: NEGATIVE

## 2018-04-12 MED ORDER — ACETAMINOPHEN 325 MG PO TABS
650.0000 mg | ORAL_TABLET | Freq: Once | ORAL | Status: AC | PRN
  Administered 2018-04-12: 650 mg via ORAL
  Filled 2018-04-12: qty 2

## 2018-04-12 MED ORDER — SODIUM CHLORIDE 0.9 % IV BOLUS (SEPSIS)
500.0000 mL | Freq: Once | INTRAVENOUS | Status: AC
  Administered 2018-04-12: 500 mL via INTRAVENOUS

## 2018-04-12 MED ORDER — KETOROLAC TROMETHAMINE 30 MG/ML IJ SOLN
30.0000 mg | Freq: Once | INTRAMUSCULAR | Status: AC
  Administered 2018-04-12: 30 mg via INTRAVENOUS
  Filled 2018-04-12: qty 1

## 2018-04-12 MED ORDER — SODIUM CHLORIDE 0.9 % IV BOLUS
1000.0000 mL | Freq: Once | INTRAVENOUS | Status: AC
  Administered 2018-04-12: 1000 mL via INTRAVENOUS

## 2018-04-12 MED ORDER — CEFTRIAXONE SODIUM 1 G IJ SOLR
1.0000 g | Freq: Once | INTRAMUSCULAR | Status: AC
  Administered 2018-04-12: 1 g via INTRAVENOUS
  Filled 2018-04-12: qty 10

## 2018-04-12 MED ORDER — SODIUM CHLORIDE 0.9 % IV SOLN
1000.0000 mL | INTRAVENOUS | Status: DC
  Administered 2018-04-12: 1000 mL via INTRAVENOUS

## 2018-04-12 MED ORDER — ACETAMINOPHEN 325 MG PO TABS
650.0000 mg | ORAL_TABLET | Freq: Once | ORAL | Status: AC
Start: 2018-04-12 — End: 2018-04-12
  Administered 2018-04-12: 650 mg via ORAL
  Filled 2018-04-12: qty 2

## 2018-04-12 NOTE — Discharge Instructions (Addendum)
Follow up with a primary care doctor in a few days to make sure your are improving, return as needed for worsening symptoms, continue the augmentin and tylenol and /or ibuprofen

## 2018-04-12 NOTE — ED Triage Notes (Signed)
Pt reports fever, sore throat, headache, chills since Saturday. Pt reports that he had a tele consult with an MD that prescribed Augmentin on Monday. Pt has been taking medication since then with no relief. Pt endorses intermittent chest pain. And Shortness of breath. Complaint is mostly the sore throat

## 2018-04-12 NOTE — ED Notes (Signed)
Pt refusing strep swab

## 2018-04-12 NOTE — ED Provider Notes (Signed)
[facility]-EMERGENCY DEPT Provider Note   CSN: [id number] Arrival date & time: 04/12/18  1654     History   Chief Complaint Chief Complaint  Patient presents with  . Fever  . Sore Throat    HPI Adam Hurley is a 42 y.o. male.  HPI Pt complains of a sore throat.  Symptoms started on Saturday.  Patient states he is having pain in both sides of his throat as well as the back of his tongue.  He did an electronic visit with a physician on Monday and was told he most likely had strep throat.  He was prescribed Augmentin.  Patient states he has continued to take that medication but is not feeling much better.  He said some intermittent discomfort in his chest and feels like he gets short of breath at times.  He continues to have fevers up to 103.  He denies any vomiting.  No abdominal pain.  No rashes. Past Medical History:  Diagnosis Date  . Sinus infection     There are no active problems to display for this patient.   History reviewed. No pertinent surgical history.      Home Medications    Prior to Admission medications   Medication Sig Start Date End Date Taking? Authorizing Provider  amoxicillin-clavulanate (AUGMENTIN) 875-125 MG tablet Take 1 tablet by mouth every 12 (twelve) hours. 03/20/16  Yes Tilden Fossa, MD  ibuprofen (ADVIL,MOTRIN) 800 MG tablet Take 1 tablet (800 mg total) by mouth 3 (three) times daily. 12/13/15  Yes Barrett, Rolm Gala, PA-C  HYDROcodone-acetaminophen (NORCO/VICODIN) 5-325 MG tablet Take 1 tablet by mouth every 6 (six) hours as needed. Patient not taking: Reported on 04/12/2018 03/20/16   Tilden Fossa, MD    Family History History reviewed. No pertinent family history.  Social History Social History   Tobacco Use  . Smoking status: Never Smoker  . Smokeless tobacco: Never Used  Substance Use Topics  . Alcohol use: No  . Drug use: No     Allergies   Patient has no known allergies.   Review of Systems Review  of Systems  All other systems reviewed and are negative.    Physical Exam Updated Vital Signs BP 126/80 (BP Location: Left Arm)   Pulse 96   Temp (!) 101.5 F (38.6 C) (Oral)   Resp 16   Wt 79.4 kg   SpO2 98%   BMI 27.41 kg/m   Physical Exam  Constitutional: He appears well-developed and well-nourished. No distress.  HENT:  Head: Normocephalic and atraumatic.  Right Ear: Tympanic membrane and external ear normal.  Left Ear: Tympanic membrane and external ear normal.  Mouth/Throat: Mucous membranes are normal. No uvula swelling. Oropharyngeal exudate, posterior oropharyngeal edema and posterior oropharyngeal erythema present. No tonsillar abscesses. Tonsils are 3+ on the right. Tonsils are 3+ on the left. Tonsillar exudate.  Eyes: Pupils are equal, round, and reactive to light. Conjunctivae and EOM are normal. Right eye exhibits no discharge. Left eye exhibits no discharge. No scleral icterus.  Neck: Normal range of motion. Neck supple. No tracheal deviation present. No thyromegaly present.  Cardiovascular: Normal rate, regular rhythm and intact distal pulses.  Pulmonary/Chest: Effort normal and breath sounds normal. No stridor. No respiratory distress. He has no wheezes. He has no rales.  Abdominal: Soft. Bowel sounds are normal. He exhibits no distension. There is no tenderness. There is no rebound and no guarding.  Musculoskeletal: He exhibits no edema or tenderness.  Lymphadenopathy:  He has cervical adenopathy.  Neurological: He is alert. He has normal strength. No cranial nerve deficit (no facial droop, extraocular movements intact, no slurred speech) or sensory deficit. He exhibits normal muscle tone. He displays no seizure activity. Coordination normal.  Skin: Skin is warm and dry. No rash noted.  Psychiatric: He has a normal mood and affect.  Nursing note and vitals reviewed.    ED Treatments / Results  Labs (all labs ordered are listed, but only abnormal results  are displayed) Labs Reviewed  BASIC METABOLIC PANEL - Abnormal; Notable for the following components:      Result Value   Potassium 3.3 (*)    Glucose, Bld 103 (*)    Calcium 8.7 (*)    All other components within normal limits  CBC WITH DIFFERENTIAL/PLATELET  MONONUCLEOSIS SCREEN    EKG EKG Interpretation  Date/Time:  Thursday April 12 2018 17:20:25 EDT Ventricular Rate:  107 PR Interval:    QRS Duration: 87 QT Interval:  308 QTC Calculation: 411 R Axis:   82 Text Interpretation:  Sinus tachycardia Borderline T wave abnormalities No significant change was found Confirmed by Linwood Dibbles [phone]) on 04/12/2018 9:06:01 PM   Radiology Dg Chest 2 View  Result Date: 04/12/2018 CLINICAL DATA:  fever, sore throat, headache, chills since Saturday. Pt reports that he had a tele consult with an MD that prescribed Augmentin on Monday. Pt has been taking medication since then with no relief. Intermittent chest pain. EXAM: CHEST - 2 VIEW COMPARISON:  None. FINDINGS: The heart size and mediastinal contours are within normal limits. Both lungs are clear. The visualized skeletal structures are unremarkable. IMPRESSION: No active cardiopulmonary disease. Electronically Signed   By: Norva Pavlov M.D.   On: 04/12/2018 19:42    Procedures Procedures (including critical care time)  Medications Ordered in ED Medications  sodium chloride 0.9 % bolus 500 mL (500 mLs Intravenous New Bag/Given 04/12/18 2131)    Followed by  0.9 %  sodium chloride infusion (1,000 mLs Intravenous New Bag/Given 04/12/18 2131)  cefTRIAXone (ROCEPHIN) 1 g in sodium chloride 0.9 % 100 mL IVPB (has no administration in time range)  acetaminophen (TYLENOL) tablet 650 mg (650 mg Oral Given 04/12/18 1715)  sodium chloride 0.9 % bolus 1,000 mL (1,000 mLs Intravenous New Bag/Given 04/12/18 2130)  ketorolac (TORADOL) 30 MG/ML injection 30 mg (30 mg Intravenous Given 04/12/18 2128)  acetaminophen (TYLENOL) tablet 650 mg (650 mg Oral Given  04/12/18 2127)     Initial Impression / Assessment and Plan / ED Course  I have reviewed the triage vital signs and the nursing notes.  Pertinent labs & imaging results that were available during my care of the patient were reviewed by me and considered in my medical decision making (see chart for details).  Clinical Course as of Apr 13 2231  Thu Apr 12, 2018  2229 Laboratory tests reviewed.  Mono test is negative.  CBC and electrolytes unremarkable   [JK]    Clinical Course User Index [JK] Linwood Dibbles, MD    Patient presents to the emergency room for evaluation of a sore throat and fever.  Patient has a clear exudative pharyngitis on exam.  He was started empirically on Augmentin for streptococcal pharyngitis 2 days ago.  Patient continues to have fever and sore throat.  No findings on exam to suggest a peritonsillar abscess or retropharyngeal abscess.  His mono test is negative.  We did not do a strep test this evening because even if  it is negative I will have him continue on the antibiotics.  Is possible it could be some other type of viral pharyngitis.  Patient was given a dose of IV antibiotics.  He does not appear to be have any difficulty handling the secretions.  I want continue to take the oral antibiotics and recommend follow-up with primary care doctor in 2 days.  Final Clinical Impressions(s) / ED Diagnoses   Final diagnoses:  Exudative pharyngitis    ED Discharge Orders    None       Linwood DibblesKnapp, Iori Gigante, MD 04/12/18 2232

## 2018-12-11 ENCOUNTER — Telehealth: Payer: Self-pay | Admitting: Physician Assistant

## 2018-12-11 DIAGNOSIS — M791 Myalgia, unspecified site: Secondary | ICD-10-CM

## 2018-12-11 NOTE — Progress Notes (Signed)
E-Visit for Corona Virus Screening  Based on your current symptoms, you may very well have the virus, however your symptoms are mild. Currently, not all patients are being tested. If the symptoms are mild and there is not a known exposure, performing the test is not indicated.  Coronavirus disease 2019 (COVID-19)is a respiratory illness that can spread from person to person. The virus that causes COVID-19 is a new virus that was first identified in the country of [place] but is now found in multiple other countries and has spread to the [place].  Symptoms associated with the virus are mild to severe fever, cough, and shortness of breath. There is currently no vaccine to protect against COVID-19, and there is no specific antiviral treatment for the virus.   To be considered HIGH RISK for Coronavirus (COVID-19), you have to meet the following criteria:  . Traveled to [place], [place], [place], [place] or [place]; or in the [place] to [place], [place], [place], or [place]; and have fever, cough, and shortness of breath within the last 2 weeks of travel OR  . Been in close contact with a person diagnosed with COVID-19 within the last 2 weeks and have fever, cough, and shortness of breath  . IF YOU DO NOT MEET THESE CRITERIA, YOU ARE CONSIDERED LOW RISK FOR COVID-19.   It is vitally important that if you feel that you have an infection such as this virus or any other virus that you stay home and away from places where you may spread it to others.  You should self-quarantine for 14 days if you have symptoms that could potentially be coronavirus and avoid contact with people age 44 and older.    You may take acetaminophen (Tylenol) as needed for fever.    Reduce your risk of any infection by using the same precautions used for avoiding the common cold or flu: Wash your hands often with soap and warm water for at least 20 seconds.  If soap and water are not readily available, use an  alcohol-based hand sanitizer with at least 60% alcohol.  If coughing or sneezing, cover your mouth and nose by coughing or sneezing into the elbow areas of your shirt or coat, into a tissue or into your sleeve (not your hands). Avoid shaking hands with others and consider head nods or verbal greetings only.  Avoid touching your eyes,nose, or mouth with unwashed hands. Avoid close contact with people who are sick. Avoid places or events with large numbers of people in one location, like concerts or sporting events. Carefully consider travel plans you have or are making. If you are planning any travel outside or inside the [place], visit the CDC'sTravelers' Health webpagefor the latest health notices. If you have some symptoms but not all symptoms, continue to monitor at home and seek medical attention if your symptoms worsen. If you are having a medical emergency, call 911.  HOME CARE Only take medications as instructed by your medical team. Drink plenty of fluids and get plenty of rest. A steam or ultrasonic humidifier can help if you have congestion.   GET HELP RIGHT AWAY IF: You develop worsening fever. You become short of breath You cough up blood. Your symptoms become more severe MAKE SURE YOU  Understand these instructions. Will watch your condition. Will get help right away if you are not doing well or get worse.  Your e-visit answers were reviewed by a board certified advanced clinical practitioner to complete your  personal care plan.  Depending on the condition, your plan could have included both over the counter or prescription medications.  If there is a problem please reply once you have received a response from your provider. Your safety is important to us.  If you have drug allergies check your prescription carefully.    You can use MyChart to ask questions about today's visit, request a non-urgent call back, or ask for a work or school excuse for 24 hours related to this  e-Visit. If it has been greater than 24 hours you will need to follow up with your provider, or enter a new e-Visit to address those concerns. You will get an e-mail in the next two days asking about your experience.  I hope that your e-visit has been valuable and will speed your recovery. Thank you for using e-visits.    ===View-only below this line===   ----- Message -----    From: Adam Hurley    Sent: 12/11/2018  8:56 AM EDT      To: E-Visit Mailing List Subject: E-Visit Submission: CoronaVirus (COVID-19) Screening  E-Visit Submission: CoronaVirus (COVID-19) Screening --------------------------------  Question: Do you have any of the following?  Answer:   Shortness of breath  Question: If you are experiencing trouble breathing please select the severity of this:  Answer:   I am not having any trouble breathing (N/A)  Question: Do you have any of the following additional symptoms?  Answer:   Body aches  Question: Have you had a fever? Answer:   No  Question: Have others in your home or workplace had similar symptoms? Answer:   No  Question: When did your symptoms start? Answer:   3 days ago  Question: Have you recently visited any of the following countries? Answer:   None of these  Question: If you have traveled anywhere in the last  2 months please document where you have visited: Answer:     Question: Have you recently been around others from these countries or visited these countries who have had coughing or fever? Answer:   No  Question: Have you recently been around anyone who has been diagnosed with Corona virus? Answer:   No  Question: Have you been taking any medications? Answer:   Yes  Question: If taking medications for these symptoms, please list the names and whether they are helping or not Answer:   Vitamin c , acemetophine  Question: Are you treated for any of the following conditions: Asthma, COPD, Diabetes, Renal Failure (on Dialysis), AIDS,  any Neuromuscular disease that effects the clearing of secretions, Heart Failure, or Heart Disease? Answer:   No  Question: Please enter a phone number where you can be reached if we have additional questions about your symptoms Answer:   [phone]  Question: Please list your medication allergies that you may have ? (If 'none' , please list as 'none') Answer:   None  Question: Please list any additional comments  Answer:   Just feel weird  A total of 5-10 minutes was spent evaluating this patients questionnaire and formulating a plan of care.

## 2019-05-30 ENCOUNTER — Other Ambulatory Visit: Payer: Self-pay | Admitting: Internal Medicine

## 2019-05-30 ENCOUNTER — Other Ambulatory Visit: Payer: Self-pay

## 2019-05-30 ENCOUNTER — Ambulatory Visit
Admission: RE | Admit: 2019-05-30 | Discharge: 2019-05-30 | Disposition: A | Discharge status: Home / Self Care | Payer: Self-pay | Source: Ambulatory Visit | Attending: Internal Medicine | Admitting: Internal Medicine
